# Patient Record
Sex: Female | Born: 1975 | Race: White | Hispanic: No | Marital: Single | Smoking: Current every day smoker
Health system: Southern US, Community
[De-identification: ages and names within clinical notes are randomized; demographics above are authoritative.]

## PROBLEM LIST (undated history)

## (undated) DIAGNOSIS — O009 Unspecified ectopic pregnancy without intrauterine pregnancy: Secondary | ICD-10-CM

## (undated) HISTORY — PX: CHOLECYSTECTOMY: SHX55

## (undated) HISTORY — PX: TUBAL LIGATION: SHX77

---

## 2010-08-12 ENCOUNTER — Emergency Department (HOSPITAL_COMMUNITY)
Admission: EM | Admit: 2010-08-12 | Discharge: 2010-08-12 | Disposition: A | Discharge status: Home / Self Care | Payer: Self-pay | Attending: Emergency Medicine | Admitting: Emergency Medicine

## 2010-08-12 ENCOUNTER — Emergency Department (HOSPITAL_COMMUNITY): Payer: Self-pay

## 2010-08-12 DIAGNOSIS — W108XXA Fall (on) (from) other stairs and steps, initial encounter: Secondary | ICD-10-CM | POA: Insufficient documentation

## 2010-08-12 DIAGNOSIS — S91109A Unspecified open wound of unspecified toe(s) without damage to nail, initial encounter: Secondary | ICD-10-CM | POA: Insufficient documentation

## 2010-08-12 DIAGNOSIS — S93609A Unspecified sprain of unspecified foot, initial encounter: Secondary | ICD-10-CM | POA: Insufficient documentation

## 2011-09-11 ENCOUNTER — Observation Stay (HOSPITAL_COMMUNITY): Payer: Self-pay

## 2011-09-11 ENCOUNTER — Encounter (HOSPITAL_COMMUNITY): Payer: Self-pay | Admitting: Nurse Practitioner

## 2011-09-11 ENCOUNTER — Observation Stay (HOSPITAL_COMMUNITY)
Admission: EM | Admit: 2011-09-11 | Discharge: 2011-09-12 | Disposition: A | Discharge status: Home / Self Care | Payer: Self-pay | Attending: Emergency Medicine | Admitting: Emergency Medicine

## 2011-09-11 DIAGNOSIS — N898 Other specified noninflammatory disorders of vagina: Principal | ICD-10-CM | POA: Insufficient documentation

## 2011-09-11 DIAGNOSIS — R109 Unspecified abdominal pain: Secondary | ICD-10-CM | POA: Insufficient documentation

## 2011-09-11 DIAGNOSIS — A499 Bacterial infection, unspecified: Secondary | ICD-10-CM | POA: Insufficient documentation

## 2011-09-11 DIAGNOSIS — N390 Urinary tract infection, site not specified: Secondary | ICD-10-CM | POA: Insufficient documentation

## 2011-09-11 DIAGNOSIS — N760 Acute vaginitis: Secondary | ICD-10-CM | POA: Insufficient documentation

## 2011-09-11 DIAGNOSIS — B9689 Other specified bacterial agents as the cause of diseases classified elsewhere: Secondary | ICD-10-CM | POA: Insufficient documentation

## 2011-09-11 HISTORY — DX: Unspecified ectopic pregnancy without intrauterine pregnancy: O00.90

## 2011-09-11 LAB — URINE MICROSCOPIC-ADD ON

## 2011-09-11 LAB — DIFFERENTIAL
Basophils Relative: 0 % (ref 0–1)
Eosinophils Absolute: 0.6 10*3/uL (ref 0.0–0.7)
Eosinophils Relative: 8 % — ABNORMAL HIGH (ref 0–5)
Monocytes Relative: 6 % (ref 3–12)
Neutrophils Relative %: 54 % (ref 43–77)

## 2011-09-11 LAB — POCT I-STAT, CHEM 8
Chloride: 103 mEq/L (ref 96–112)
Glucose, Bld: 89 mg/dL (ref 70–99)
HCT: 46 % (ref 36.0–46.0)
Hemoglobin: 15.6 g/dL — ABNORMAL HIGH (ref 12.0–15.0)
Potassium: 3.7 mEq/L (ref 3.5–5.1)
Sodium: 143 mEq/L (ref 135–145)

## 2011-09-11 LAB — URINALYSIS, ROUTINE W REFLEX MICROSCOPIC
Bilirubin Urine: NEGATIVE
Ketones, ur: NEGATIVE mg/dL
Nitrite: POSITIVE — AB
Urobilinogen, UA: 1 mg/dL (ref 0.0–1.0)
pH: 7 (ref 5.0–8.0)

## 2011-09-11 LAB — CBC
Hemoglobin: 14.5 g/dL (ref 12.0–15.0)
MCH: 28.4 pg (ref 26.0–34.0)
MCHC: 33 g/dL (ref 30.0–36.0)
MCV: 86.3 fL (ref 78.0–100.0)

## 2011-09-11 LAB — WET PREP, GENITAL: Trich, Wet Prep: NONE SEEN

## 2011-09-11 LAB — POCT PREGNANCY, URINE: Preg Test, Ur: NEGATIVE

## 2011-09-11 MED ORDER — OXYCODONE-ACETAMINOPHEN 5-325 MG PO TABS
1.0000 | ORAL_TABLET | Freq: Once | ORAL | Status: AC
  Administered 2011-09-11: 1 via ORAL
  Filled 2011-09-11: qty 1

## 2011-09-11 MED ORDER — DEXTROSE 5 % IV SOLN
1.0000 g | Freq: Once | INTRAVENOUS | Status: AC
  Administered 2011-09-11: 1 g via INTRAVENOUS
  Filled 2011-09-11: qty 10

## 2011-09-11 MED ORDER — METRONIDAZOLE 500 MG PO TABS: 500.0000 mg | ORAL_TABLET | Freq: Two times a day (BID) | ORAL | Status: DC

## 2011-09-11 MED ORDER — IOHEXOL 300 MG/ML  SOLN
80.0000 mL | Freq: Once | INTRAMUSCULAR | Status: AC | PRN
  Administered 2011-09-11: 80 mL via INTRAVENOUS

## 2011-09-11 MED ORDER — IOHEXOL 300 MG/ML  SOLN
20.0000 mL | INTRAMUSCULAR | Status: AC
  Administered 2011-09-11: 20 mL via ORAL

## 2011-09-11 MED ORDER — NITROFURANTOIN MONOHYD MACRO 100 MG PO CAPS: 100.0000 mg | ORAL_CAPSULE | Freq: Two times a day (BID) | ORAL | Status: DC

## 2011-09-11 NOTE — ED Provider Notes (Signed)
History     CSN: [id number]  Arrival date & time 09/11/11  1638   First MD Initiated Contact with Patient 09/11/11 2006      Chief Complaint  Patient presents with  . Vaginal Bleeding    (Consider location/radiation/quality/duration/timing/severity/associated sxs/prior treatment) Patient is a 36 y.o. female presenting with vaginal bleeding. The history is provided by the patient and the spouse. No language interpreter was used.  Vaginal Bleeding This is a recurrent problem. The current episode started more than 1 month ago. The problem occurs daily. The problem has been gradually worsening. Associated symptoms include fatigue. Pertinent negatives include no chest pain, fever, headaches, nausea, neck pain, vomiting or weakness. The treatment provided mild relief.   36 year old female coming in with complaints of vaginal bleeding with clot formation. States that her periods are always right on time. She started on June 1 and has not stopped. She is now passing clots which is here in usual for her. She's having general abdominal pain especially in the suprapubic and right upper quadrant. Denies dizziness. States she has been laying in the bed the last 2 days unable to move. States that her stomach has doubled in size in the last week. [facility] Mayford Knife. The second Excedrin for pain. His past medical history tubal ligation 11 years ago ectopic pregnancy 13 years ago abortion 18 years ago.   Past Medical History  Diagnosis Date  . Ectopic pregnancy     Past Surgical History  Procedure Date  . Tubal ligation   . Cholecystectomy     History reviewed. No pertinent family history.  History  Substance Use Topics  . Smoking status: Current Everyday Smoker -- 0.5 packs/day  . Smokeless tobacco: Not on file  . Alcohol Use: Yes     social    OB History    Grav Para Term Preterm Abortions TAB SAB Ect Mult Living                  Review of Systems    Constitutional: Positive for fatigue. Negative for fever.  HENT: Negative for neck pain.   Respiratory: Negative for shortness of breath.   Cardiovascular: Negative for chest pain.  Gastrointestinal: Negative for nausea and vomiting.  Genitourinary: Positive for vaginal bleeding. Negative for dysuria, urgency, flank pain, vaginal discharge, difficulty urinating and vaginal pain.  Musculoskeletal: Negative for back pain.  Neurological: Negative for dizziness, weakness, light-headedness and headaches.  All other systems reviewed and are negative.    Allergies  Review of patient's allergies indicates no known allergies.  Home Medications   Current Outpatient Rx  Name Route Sig Dispense Refill  . EXCEDRIN PO Oral Take 2 tablets by mouth every 6 (six) hours as needed. For pain      BP 105/74  Pulse 66  Temp 97.1 F (36.2 C) (Oral)  Resp 16  Ht 5\' 11"  (1.803 m)  Wt 200 lb (90.719 kg)  BMI 27.89 kg/m2  SpO2 100%  Physical Exam  Nursing note and vitals reviewed. Constitutional: She is oriented to person, place, and time. She appears well-developed and well-nourished.  HENT:  Head: Normocephalic and atraumatic.  Eyes: Conjunctivae and EOM are normal. Pupils are equal, round, and reactive to light.  Neck: Normal range of motion. Neck supple.  Cardiovascular: Normal rate.   Pulmonary/Chest: Effort normal.  Abdominal: Soft. Bowel sounds are normal. She exhibits no distension.  Genitourinary: Vagina normal. There is no rash, tenderness or lesion on the right  labia. There is no rash, tenderness or lesion on the left labia. Cervix exhibits no motion tenderness, no discharge and no friability. Right adnexum displays tenderness. Right adnexum displays no mass. Left adnexum displays tenderness. Left adnexum displays no mass. No erythema around the vagina. No signs of injury around the vagina. No vaginal discharge found.  Musculoskeletal: Normal range of motion. She exhibits no edema and no  tenderness.  Neurological: She is alert and oriented to person, place, and time. She has normal reflexes.  Skin: Skin is warm and dry.  Psychiatric: She has a normal mood and affect.    ED Course  Pelvic exam Date/Time: 09/11/2011 8:19 PM Performed by: Remi Haggard Authorized by: Remi Haggard Consent: Verbal consent obtained. Written consent not obtained. Risks and benefits: risks, benefits and alternatives were discussed Consent given by: patient Patient understanding: patient states understanding of the procedure being performed Patient identity confirmed: verbally with patient, arm band, provided demographic data and hospital-assigned identification number Time out: Immediately prior to procedure a "time out" was called to verify the correct patient, procedure, equipment, support staff and site/side marked as required. Local anesthesia used: no Patient sedated: no Patient tolerance: Patient tolerated the procedure well with no immediate complications.   (including critical care time)  Labs Reviewed  DIFFERENTIAL - Abnormal; Notable for the following:    Eosinophils Relative 8 (*)     All other components within normal limits  POCT I-STAT, CHEM 8 - Abnormal; Notable for the following:    Hemoglobin 15.6 (*)     All other components within normal limits  CBC  POCT PREGNANCY, URINE  URINALYSIS, ROUTINE W REFLEX MICROSCOPIC  WET PREP, GENITAL  GC/CHLAMYDIA PROBE AMP, GENITAL   No results found.   No diagnosis found.    MDM  2130pm patient will be move to CDU awaiting a CT of the abdomen and pelvis for heavy vaginal bleeding, suprapubic, right upper quadrant right lower quadrant pain. Report given to [facility].       Remi Haggard, NP 09/11/11 2322

## 2011-09-11 NOTE — ED Provider Notes (Signed)
10:52 PM Patient is in CDU holding for CT abd/pelvis.  Sign out received from Remi Haggard, NP.  Pt with abnormal vaginal bleeding and abdominal pain, found to have BV and UTI.   Pt being given rocephin in CDU.  Anticipate d/c home with Rx for flagyl and macrobid.  Pt to have CT abd/pelvis, dispo pending results.   Pt reporting continued pain in her abdomen, described as crampy in her lower abdomen and gassy in her upper abdomen.  CT tech currently taking pt to CT - will continue to follow pt and examine upon her return.    Abdomen soft, nondistended, tender to palpation throughout lower abdomen, no guarding, no rebound.  Pt concerned about cost of medications - I have discussed this with Thurston Hole who will change Rx from macrobid to something more affordable.   11:58 PM Ct showed question of polyp or mass in the endometrium.  Will send for pelvic US.  Results discussed with patient.    Remi Haggard, NP, reassumes care of patient at change of shift.  Patient also discussed with Dr Judd Lien, overnight doctor.    Results for orders placed during the hospital encounter of 09/11/11  URINALYSIS, ROUTINE W REFLEX MICROSCOPIC      Component Value Range   Color, Urine AMBER (*) YELLOW   APPearance CLOUDY (*) CLEAR   Specific Gravity, Urine 1.024  1.005 - 1.030   pH 7.0  5.0 - 8.0   Glucose, UA NEGATIVE  NEGATIVE mg/dL   Hgb urine dipstick LARGE (*) NEGATIVE   Bilirubin Urine NEGATIVE  NEGATIVE   Ketones, ur NEGATIVE  NEGATIVE mg/dL   Protein, ur 30 (*) NEGATIVE mg/dL   Urobilinogen, UA 1.0  0.0 - 1.0 mg/dL   Nitrite POSITIVE (*) NEGATIVE   Leukocytes, UA TRACE (*) NEGATIVE  CBC      Component Value Range   WBC 7.3  4.0 - 10.5 K/uL   RBC 5.10  3.87 - 5.11 MIL/uL   Hemoglobin 14.5  12.0 - 15.0 g/dL   HCT 91.4  78.2 - 95.6 %   MCV 86.3  78.0 - 100.0 fL   MCH 28.4  26.0 - 34.0 pg   MCHC 33.0  30.0 - 36.0 g/dL   RDW 21.3  08.6 - 57.8 %   Platelets 267  150 - 400 K/uL  DIFFERENTIAL      Component Value  Range   Neutrophils Relative 54  43 - 77 %   Neutro Abs 4.0  1.7 - 7.7 K/uL   Lymphocytes Relative 32  12 - 46 %   Lymphs Abs 2.3  0.7 - 4.0 K/uL   Monocytes Relative 6  3 - 12 %   Monocytes Absolute 0.5  0.1 - 1.0 K/uL   Eosinophils Relative 8 (*) 0 - 5 %   Eosinophils Absolute 0.6  0.0 - 0.7 K/uL   Basophils Relative 0  0 - 1 %   Basophils Absolute 0.0  0.0 - 0.1 K/uL  WET PREP, GENITAL      Component Value Range   Yeast Wet Prep HPF POC NONE SEEN  NONE SEEN   Trich, Wet Prep NONE SEEN  NONE SEEN   Clue Cells Wet Prep HPF POC MODERATE (*) NONE SEEN   WBC, Wet Prep HPF POC NONE SEEN  NONE SEEN  POCT PREGNANCY, URINE      Component Value Range   Preg Test, Ur NEGATIVE  NEGATIVE  POCT I-STAT, CHEM 8      Component  Value Range   Sodium 143  135 - 145 mEq/L   Potassium 3.7  3.5 - 5.1 mEq/L   Chloride 103  96 - 112 mEq/L   BUN 7  6 - 23 mg/dL   Creatinine, Ser 4.69  0.50 - 1.10 mg/dL   Glucose, Bld 89  70 - 99 mg/dL   Calcium, Ion 6.29  5.28 - 1.32 mmol/L   TCO2 25  0 - 100 mmol/L   Hemoglobin 15.6 (*) 12.0 - 15.0 g/dL   HCT 41.3  24.4 - 01.0 %  URINE MICROSCOPIC-ADD ON      Component Value Range   Squamous Epithelial / LPF FEW (*) RARE   WBC, UA 3-6  <3 WBC/hpf   RBC / HPF 11-20  <3 RBC/hpf   Urine-Other AMORPHOUS URATES/PHOSPHATES     Ct Abdomen Pelvis W Contrast  09/11/2011  *RADIOLOGY REPORT*  Clinical Data: Lower abdominal pain, vaginal bleeding and cramping for 2 weeks.  History of ectopic pregnancy and tubal ligation.  CT ABDOMEN AND PELVIS WITH CONTRAST  Technique:  Multidetector CT imaging of the abdomen and pelvis was performed following the standard protocol during bolus administration of intravenous contrast.  Contrast: 80mL OMNIPAQUE IOHEXOL 300 MG/ML  SOLN  Comparison: None.  Findings: The visualized lung bases are clear.  The liver and spleen are unremarkable in appearance.  The patient is status post cholecystectomy, with clips noted at the gallbladder fossa.  The  pancreas and adrenal glands are unremarkable.  Mild scarring is noted at the upper pole of the right kidney; the kidneys are otherwise unremarkable in appearance.  There is no evidence of hydronephrosis.  No renal or ureteral stones are seen. No perinephric stranding is appreciated.  No free fluid is identified.  The small bowel is unremarkable in appearance.  The stomach is within normal limits.  No acute vascular abnormalities are seen.  The appendix is normal in caliber and contains air, without evidence for appendicitis.  The colon is unremarkable in appearance.  The bladder is mildly distended and grossly unremarkable in appearance.  A focus of enhancement is noted within the endometrial canal, raising question for a polyp or other mass.  Further evaluation is recommended.  The ovaries are relatively symmetric; no suspicious adnexal masses are seen.  A tiny hernia is noted at the left inguinal region.  No inguinal lymphadenopathy is seen.  No acute osseous abnormalities are identified.  IMPRESSION:  1.  Focus of enhancement within the endometrial canal raises question for a polyp or other mass.  Further evaluation is recommended. 2.  Tiny left inguinal hernia incidentally noted, containing only fat. 3.  Mild scarring at the upper pole of the right kidney.  Original Report Authenticated By: Tonia Ghent, M.D.      Rise Patience, [place] 09/12/11 0000

## 2011-09-11 NOTE — ED Notes (Signed)
Pt c/o vag bleeding with clots, and severe abd cramping x 2 weeks. States she has never had an abnormal period prior to this one. Tubal ligation 11 years ago. Has been changing pads q 1-2 hours

## 2011-09-11 NOTE — ED Notes (Signed)
Pt finished drinking contrast, CT made aware.

## 2011-09-12 LAB — GC/CHLAMYDIA PROBE AMP, GENITAL: Chlamydia, DNA Probe: NEGATIVE

## 2011-09-12 MED ORDER — CIPROFLOXACIN HCL 500 MG PO TABS: 500.0000 mg | ORAL_TABLET | Freq: Two times a day (BID) | ORAL | Status: AC

## 2011-09-12 MED ORDER — OXYCODONE-ACETAMINOPHEN 5-325 MG PO TABS: 2.0000 | ORAL_TABLET | ORAL | Status: AC | PRN

## 2011-09-12 MED ORDER — MEDROXYPROGESTERONE ACETATE 5 MG PO TABS: 5.0000 mg | ORAL_TABLET | Freq: Every day | ORAL | Status: DC

## 2011-09-12 MED ORDER — ONDANSETRON HCL 4 MG PO TABS: 4.0000 mg | ORAL_TABLET | Freq: Four times a day (QID) | ORAL | Status: AC

## 2011-09-12 MED ORDER — METRONIDAZOLE 500 MG PO TABS: 500.0000 mg | ORAL_TABLET | Freq: Two times a day (BID) | ORAL | Status: AC

## 2011-09-12 NOTE — ED Provider Notes (Signed)
Medical screening examination/treatment/procedure(s) were performed by non-physician practitioner and as supervising physician I was immediately available for consultation/collaboration.   Gerhard Munch, MD 09/12/11 [phone]

## 2011-09-12 NOTE — ED Notes (Signed)
Pt back from CT.  Denies abd pain or nausea, but continues to feel lower back pain. Back pain decreased when pillow placed behind lower back.

## 2011-09-12 NOTE — ED Notes (Signed)
Patient transported to Ultrasound 

## 2011-09-12 NOTE — Discharge Instructions (Signed)
Hannah Frost the tests that we did on the pelvic exam showed that she had a section called bacterial vaginosis. This is treated with Flagyl do not drink alcohol with Flagyl. You also have a urinary tract infection he'll dizzy IV antibiotics in the ER and you need to continue to take the antibiotics are prescribed until air column. The Percocet is for pain but do not drive with this medication. Zofran is for nausea. The ultrasound and CT scan showed that you have a polyp that probably needs to be biopsied.  Get a GYN at the [facility] one of your own to follow up with.  Your labs were normal.  The provera is for the excessive bleeding.   Bacterial Vaginosis Bacterial vaginosis (BV) is a vaginal infection where the normal balance of bacteria in the vagina is disrupted. The normal balance is then replaced by an overgrowth of certain bacteria. There are several different kinds of bacteria that can cause BV. BV is the most common vaginal infection in women of childbearing age. CAUSES   The cause of BV is not fully understood. BV develops when there is an increase or imbalance of harmful bacteria.   Some activities or behaviors can upset the normal balance of bacteria in the vagina and put women at increased risk including:   Having a new sex partner or multiple sex partners.   Douching.   Using an intrauterine device (IUD) for contraception.   It is not clear what role sexual activity plays in the development of BV. However, women that have never had sexual intercourse are rarely infected with BV.  Women do not get BV from toilet seats, bedding, swimming pools or from touching objects around them.  SYMPTOMS   Grey vaginal discharge.   A fish-like odor with discharge, especially after sexual intercourse.   Itching or burning of the vagina and vulva.   Burning or pain with urination.   Some women have no signs or symptoms at all.  DIAGNOSIS  Your caregiver must examine the vagina for  signs of BV. Your caregiver will perform lab tests and look at the sample of vaginal fluid through a microscope. They will look for bacteria and abnormal cells (clue cells), a pH test higher than 4.5, and a positive amine test all associated with BV.  RISKS AND COMPLICATIONS   Pelvic inflammatory disease (PID).   Infections following gynecology surgery.   Developing HIV.   Developing herpes virus.  TREATMENT  Sometimes BV will clear up without treatment. However, all women with symptoms of BV should be treated to avoid complications, especially if gynecology surgery is planned. Female partners generally do not need to be treated. However, BV may spread between female sex partners so treatment is helpful in preventing a recurrence of BV.   BV may be treated with antibiotics. The antibiotics come in either pill or vaginal cream forms. Either can be used with nonpregnant or pregnant women, but the recommended dosages differ. These antibiotics are not harmful to the baby.   BV can recur after treatment. If this happens, a second round of antibiotics will often be prescribed.   Treatment is important for pregnant women. If not treated, BV can cause a premature delivery, especially for a pregnant woman who had a premature birth in the past. All pregnant women who have symptoms of BV should be checked and treated.   For chronic reoccurrence of BV, treatment with a type of prescribed gel vaginally twice a week is helpful.  HOME CARE INSTRUCTIONS   Finish all medication as directed by your caregiver.   Do not have sex until treatment is completed.   Tell your sexual partner that you have a vaginal infection. They should see their caregiver and be treated if they have problems, such as a mild rash or itching.   Practice safe sex. Use condoms. Only have 1 sex partner.  PREVENTION  Basic prevention steps can help reduce the risk of upsetting the natural balance of bacteria in the vagina and  developing BV:  Do not have sexual intercourse (be abstinent).   Do not douche.   Use all of the medicine prescribed for treatment of BV, even if the signs and symptoms go away.   Tell your sex partner if you have BV. That way, they can be treated, if needed, to prevent reoccurrence.  SEEK MEDICAL CARE IF:   Your symptoms are not improving after 3 days of treatment.   You have increased discharge, pain, or fever.  MAKE SURE YOU:   Understand these instructions.   Will watch your condition.   Will get help right away if you are not doing well or get worse.  FOR MORE INFORMATION  Division of STD Prevention (DSTDP), Centers for Disease Control and Prevention: [employer] American Social Health Association (ASHA): www.ashastd.org  Document Released: 03/16/2005 Document Revised: 03/05/2011 Document Reviewed: 09/06/2008 [facility] Patient Information 2012 [place], [place].Bacterial Vaginosis Bacterial vaginosis (BV) is a vaginal infection where the normal balance of bacteria in the vagina is disrupted. The normal balance is then replaced by an overgrowth of certain bacteria. There are several different kinds of bacteria that can cause BV. BV is the most common vaginal infection in women of childbearing age. CAUSES   The cause of BV is not fully understood. BV develops when there is an increase or imbalance of harmful bacteria.   Some activities or behaviors can upset the normal balance of bacteria in the vagina and put women at increased risk including:   Having a new sex partner or multiple sex partners.   Douching.   Using an intrauterine device (IUD) for contraception.   It is not clear what role sexual activity plays in the development of BV. However, women that have never had sexual intercourse are rarely infected with BV.  Women do not get BV from toilet seats, bedding, swimming pools or from touching objects around them.  SYMPTOMS   Grey vaginal discharge.   A fish-like  odor with discharge, especially after sexual intercourse.   Itching or burning of the vagina and vulva.   Burning or pain with urination.   Some women have no signs or symptoms at all.  DIAGNOSIS  Your caregiver must examine the vagina for signs of BV. Your caregiver will perform lab tests and look at the sample of vaginal fluid through a microscope. They will look for bacteria and abnormal cells (clue cells), a pH test higher than 4.5, and a positive amine test all associated with BV.  RISKS AND COMPLICATIONS   Pelvic inflammatory disease (PID).   Infections following gynecology surgery.   Developing HIV.   Developing herpes virus.  TREATMENT  Sometimes BV will clear up without treatment. However, all women with symptoms of BV should be treated to avoid complications, especially if gynecology surgery is planned. Female partners generally do not need to be treated. However, BV may spread between female sex partners so treatment is helpful in preventing a recurrence of BV.   BV may be treated  with antibiotics. The antibiotics come in either pill or vaginal cream forms. Either can be used with nonpregnant or pregnant women, but the recommended dosages differ. These antibiotics are not harmful to the baby.   BV can recur after treatment. If this happens, a second round of antibiotics will often be prescribed.   Treatment is important for pregnant women. If not treated, BV can cause a premature delivery, especially for a pregnant woman who had a premature birth in the past. All pregnant women who have symptoms of BV should be checked and treated.   For chronic reoccurrence of BV, treatment with a type of prescribed gel vaginally twice a week is helpful.  HOME CARE INSTRUCTIONS   Finish all medication as directed by your caregiver.   Do not have sex until treatment is completed.   Tell your sexual partner that you have a vaginal infection. They should see their caregiver and be treated if  they have problems, such as a mild rash or itching.   Practice safe sex. Use condoms. Only have 1 sex partner.  PREVENTION  Basic prevention steps can help reduce the risk of upsetting the natural balance of bacteria in the vagina and developing BV:  Do not have sexual intercourse (be abstinent).   Do not douche.   Use all of the medicine prescribed for treatment of BV, even if the signs and symptoms go away.   Tell your sex partner if you have BV. That way, they can be treated, if needed, to prevent reoccurrence.  SEEK MEDICAL CARE IF:   Your symptoms are not improving after 3 days of treatment.   You have increased discharge, pain, or fever.  MAKE SURE YOU:   Understand these instructions.   Will watch your condition.   Will get help right away if you are not doing well or get worse.  FOR MORE INFORMATION  Division of STD Prevention (DSTDP), Centers for Disease Control and Prevention: [employer] American Social Health Association (ASHA): www.ashastd.org  Document Released: 03/16/2005 Document Revised: 03/05/2011 Document Reviewed: 09/06/2008 [facility] Patient Information 2012 [place], [place].

## 2011-09-12 NOTE — ED Provider Notes (Signed)
I have been available throughout the patient's care both in the emergency department and in the CDU.   Gerhard Munch, MD 09/12/11 [phone]

## 2012-12-14 IMAGING — CT CT ABD-PELV W/ CM
2 of 4 series · 17 of 46 positions shown, 19 images · IV contrast (omnipaque)
Comparison: None.

CLINICAL DATA: Lower abdominal pain, vaginal bleeding and cramping
for 2 weeks.  History of ectopic pregnancy and tubal ligation.

CT ABDOMEN AND PELVIS WITH CONTRAST
TECHNIQUE: Multidetector CT imaging of the abdomen and pelvis was
performed following the standard protocol during bolus
administration of intravenous contrast.
Contrast: 80mL OMNIPAQUE IOHEXOL 300 MG/ML  SOLN

[Series 2: abd/pelv with 5.0 b31f st · axial · 0.79mm/px · z∈[-420,+20]mm · 14 of 97 slices shown, 16 images]
[im 5/97  soft-tissue]
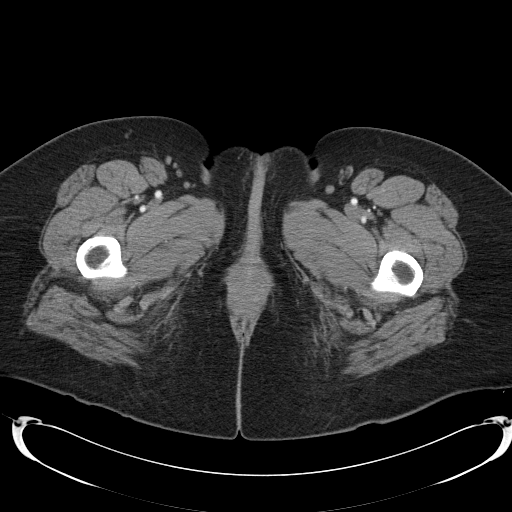
[im 5/97  bone]
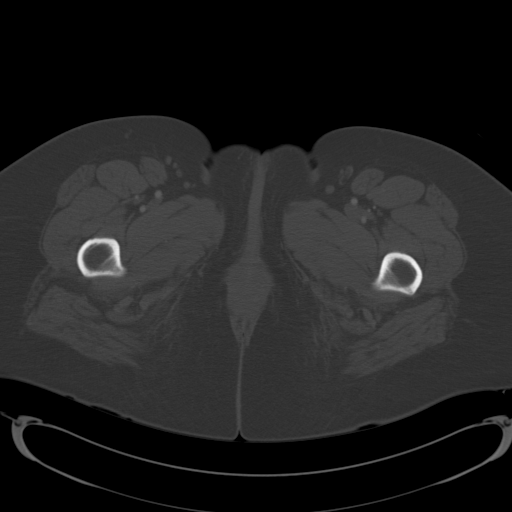
[im 13/97  soft-tissue]
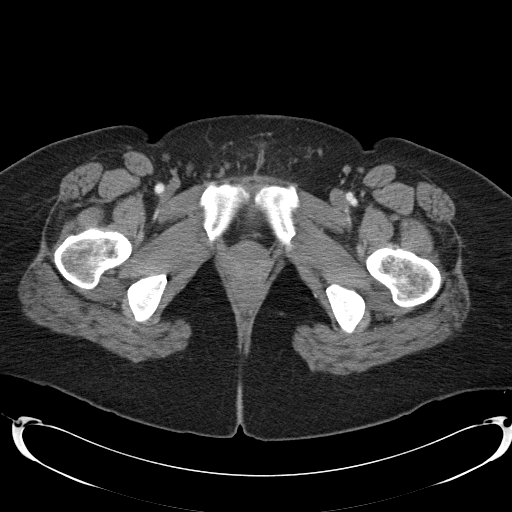
[im 21/97  soft-tissue]
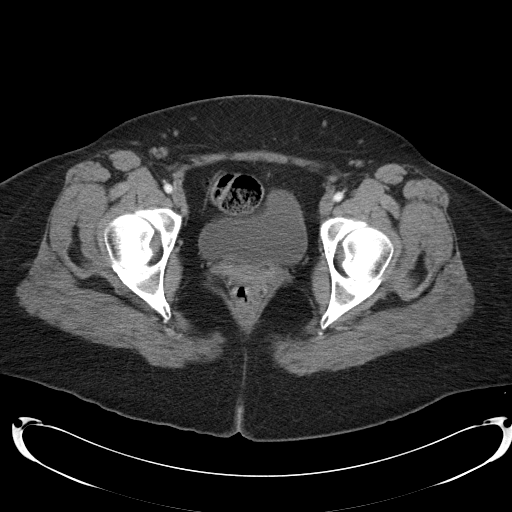
[im 25/97  soft-tissue]
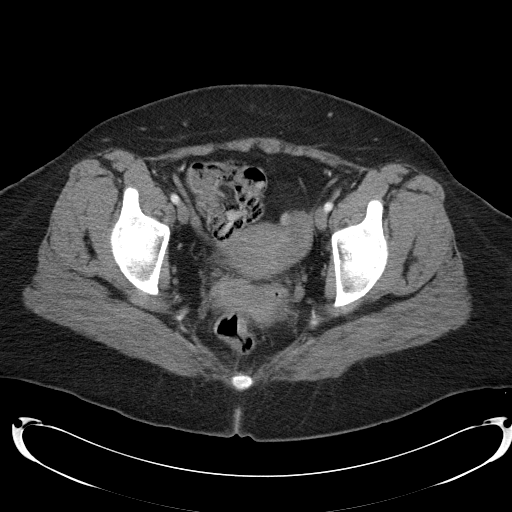
[im 33/97  soft-tissue]
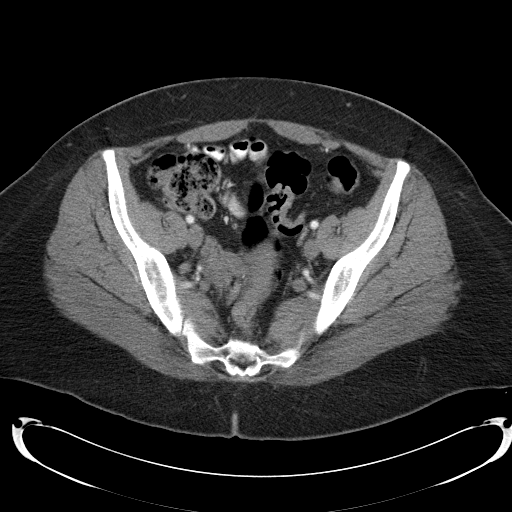
[im 41/97  soft-tissue]
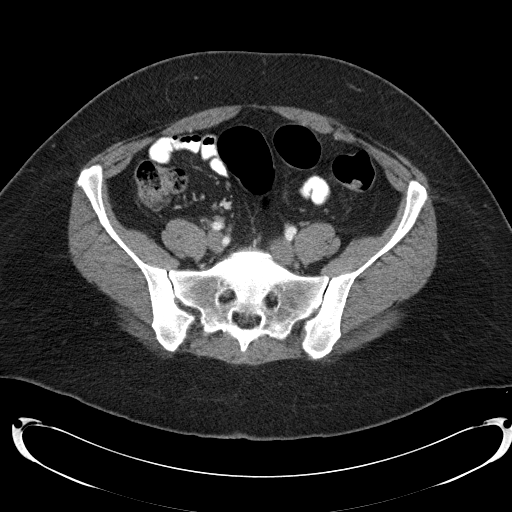
[im 45/97  soft-tissue]
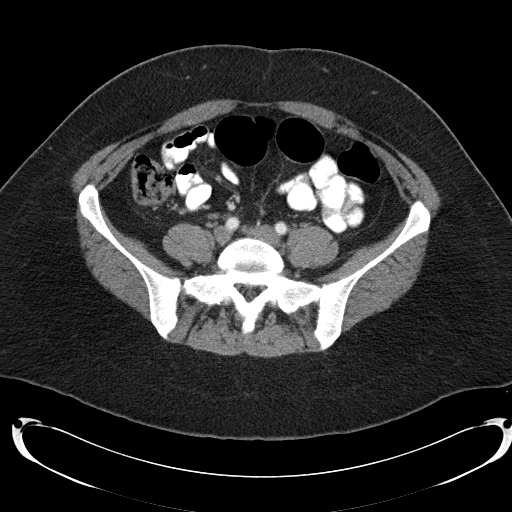
[im 53/97  soft-tissue]
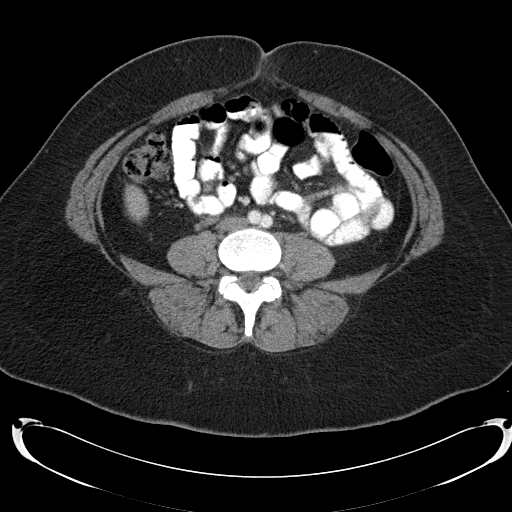
[im 57/97  soft-tissue]
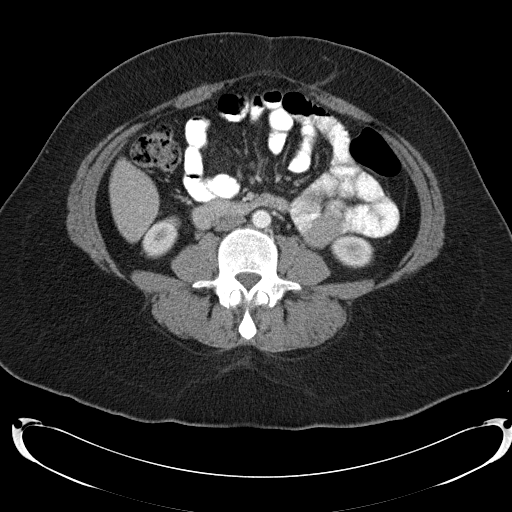
[im 57/97  bone]
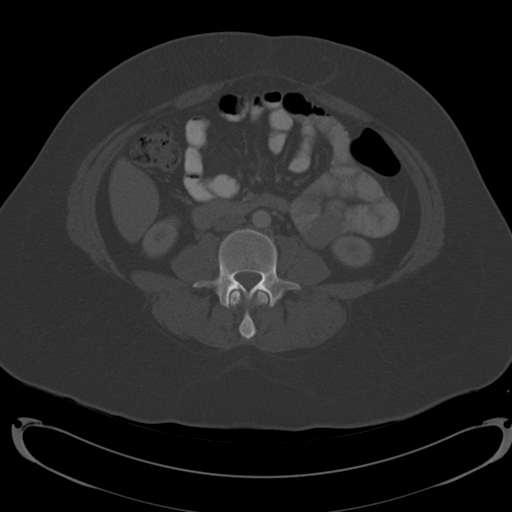
[im 65/97  soft-tissue]
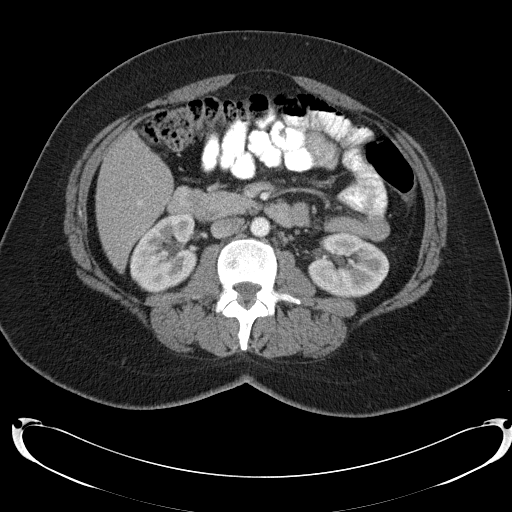
[im 73/97  soft-tissue]
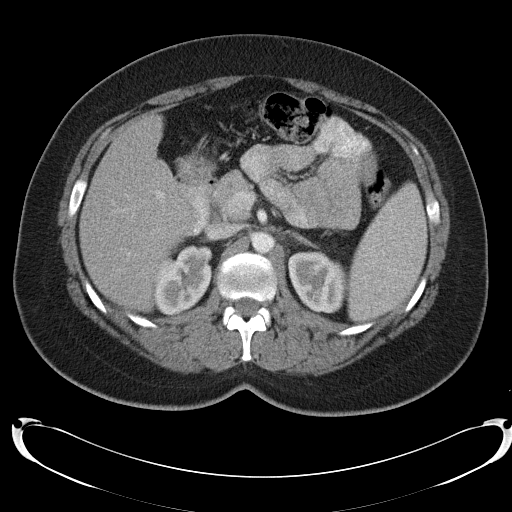
[im 77/97  soft-tissue]
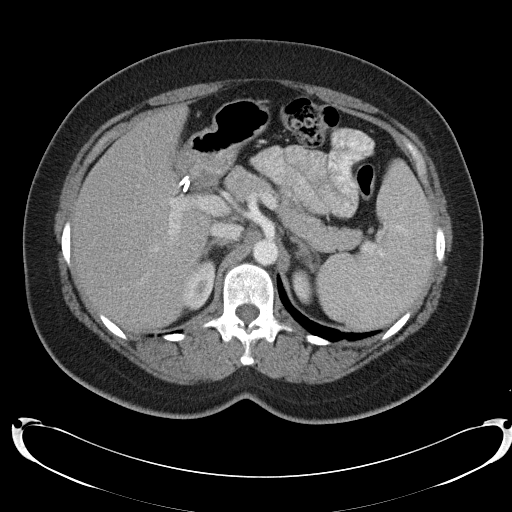
[im 85/97  soft-tissue]
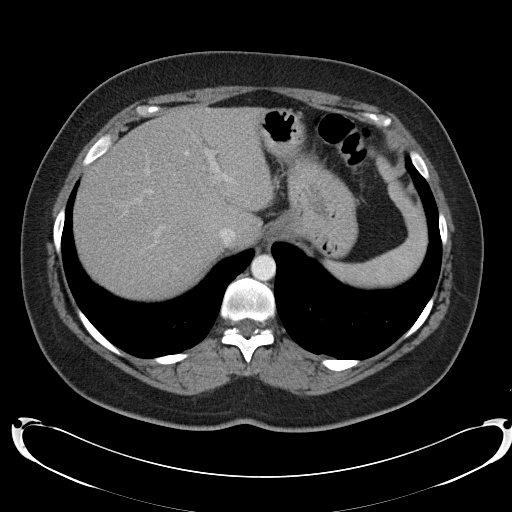
[im 93/97  soft-tissue]
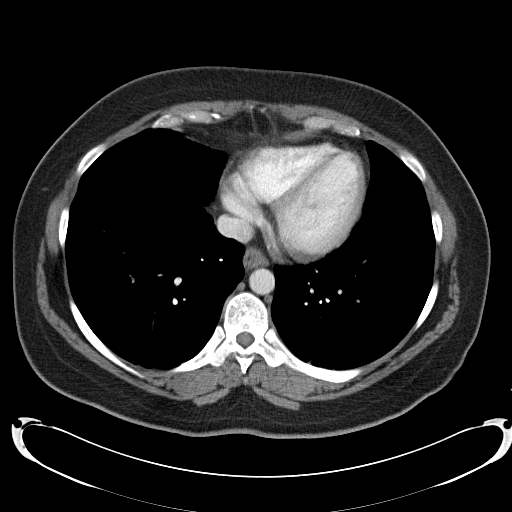

[Series 5: coronals · coronal · 0.94mm/px · 3 of 151 slices shown]
[im 51/151  soft-tissue]
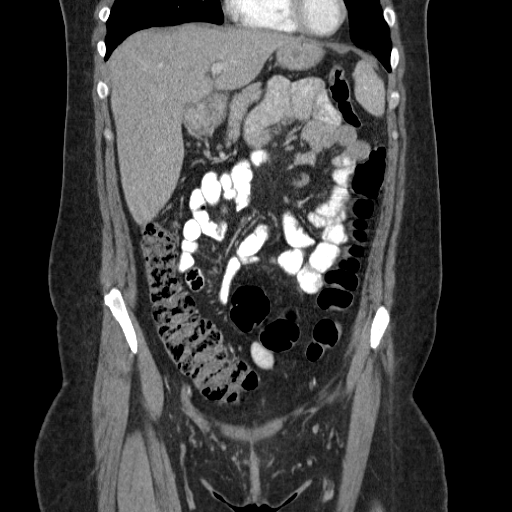
[im 67/151  soft-tissue]
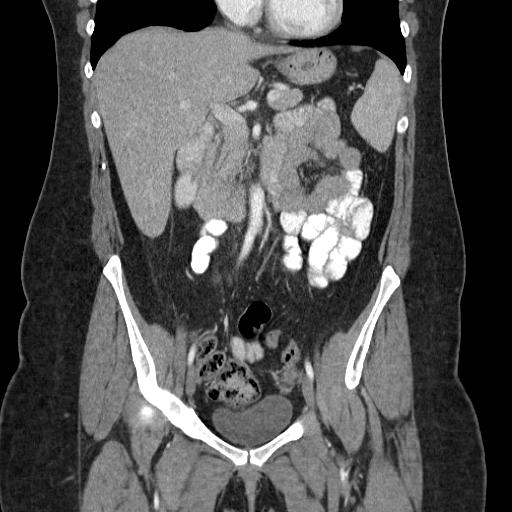
[im 84/151  soft-tissue]
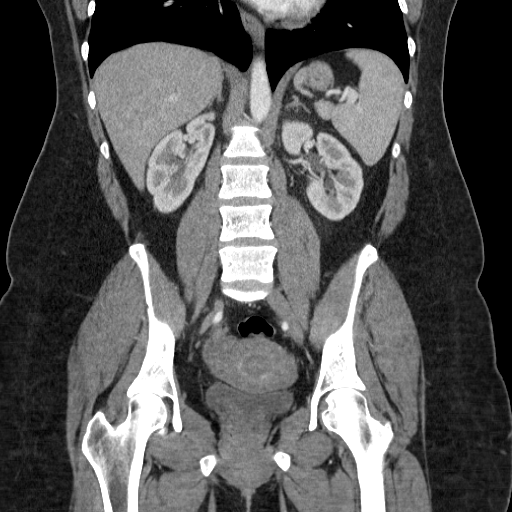

[17 of 46 positions shown; findings below may reference images not displayed]

FINDINGS: The visualized lung bases are clear.

The liver and spleen are unremarkable in appearance.  The patient
is status post cholecystectomy, with clips noted at the gallbladder
fossa.  The pancreas and adrenal glands are unremarkable.

Mild scarring is noted at the upper pole of the right kidney; the
kidneys are otherwise unremarkable in appearance.  There is no
evidence of hydronephrosis.  No renal or ureteral stones are seen.
No perinephric stranding is appreciated.

No free fluid is identified.  The small bowel is unremarkable in
appearance.  The stomach is within normal limits.  No acute
vascular abnormalities are seen.

The appendix is normal in caliber and contains air, without
evidence for appendicitis.  The colon is unremarkable in
appearance.

The bladder is mildly distended and grossly unremarkable in
appearance.  A focus of enhancement is noted within the endometrial
canal, raising question for a polyp or other mass.  Further
evaluation is recommended.  The ovaries are relatively symmetric;
no suspicious adnexal masses are seen.  A tiny hernia is noted at
the left inguinal region.  No inguinal lymphadenopathy is seen.

No acute osseous abnormalities are identified.
IMPRESSION: 1.  Focus of enhancement within the endometrial canal raises
question for a polyp or other mass.  Further evaluation is
recommended.
2.  Tiny left inguinal hernia incidentally noted, containing only
fat.
3.  Mild scarring at the upper pole of the right kidney.

## 2013-07-23 ENCOUNTER — Emergency Department (HOSPITAL_COMMUNITY)
Admission: EM | Admit: 2013-07-23 | Discharge: 2013-07-23 | Disposition: A | Discharge status: Home / Self Care | Payer: Self-pay | Attending: Emergency Medicine | Admitting: Emergency Medicine

## 2013-07-23 ENCOUNTER — Encounter (HOSPITAL_COMMUNITY): Payer: Self-pay | Admitting: Emergency Medicine

## 2013-07-23 DIAGNOSIS — F17200 Nicotine dependence, unspecified, uncomplicated: Secondary | ICD-10-CM | POA: Insufficient documentation

## 2013-07-23 DIAGNOSIS — IMO0002 Reserved for concepts with insufficient information to code with codable children: Secondary | ICD-10-CM

## 2013-07-23 DIAGNOSIS — H8110 Benign paroxysmal vertigo, unspecified ear: Secondary | ICD-10-CM | POA: Insufficient documentation

## 2013-07-23 LAB — COMPREHENSIVE METABOLIC PANEL
ALK PHOS: 58 U/L (ref 39–117)
ALT: 24 U/L (ref 0–35)
AST: 21 U/L (ref 0–37)
Albumin: 3.5 g/dL (ref 3.5–5.2)
BILIRUBIN TOTAL: 0.2 mg/dL — AB (ref 0.3–1.2)
BUN: 11 mg/dL (ref 6–23)
CO2: 24 mEq/L (ref 19–32)
Calcium: 9.2 mg/dL (ref 8.4–10.5)
Chloride: 102 mEq/L (ref 96–112)
Creatinine, Ser: 0.97 mg/dL (ref 0.50–1.10)
GFR calc Af Amer: 84 mL/min — ABNORMAL LOW (ref >90)
GFR calc non Af Amer: 73 mL/min — ABNORMAL LOW (ref >90)
Glucose, Bld: 92 mg/dL (ref 70–99)
POTASSIUM: 4.1 meq/L (ref 3.7–5.3)
SODIUM: 137 meq/L (ref 137–147)
TOTAL PROTEIN: 7 g/dL (ref 6.0–8.3)

## 2013-07-23 LAB — CBC WITH DIFFERENTIAL/PLATELET
BASOS PCT: 0 % (ref 0–1)
Basophils Absolute: 0 10*3/uL (ref 0.0–0.1)
Eosinophils Absolute: 0.4 10*3/uL (ref 0.0–0.7)
Eosinophils Relative: 5 % (ref 0–5)
HCT: 43.2 % (ref 36.0–46.0)
Hemoglobin: 13.8 g/dL (ref 12.0–15.0)
Lymphocytes Relative: 33 % (ref 12–46)
Lymphs Abs: 2.5 10*3/uL (ref 0.7–4.0)
MCH: 26.4 pg (ref 26.0–34.0)
MCHC: 31.9 g/dL (ref 30.0–36.0)
MCV: 82.6 fL (ref 78.0–100.0)
MONOS PCT: 7 % (ref 3–12)
Monocytes Absolute: 0.5 10*3/uL (ref 0.1–1.0)
NEUTROS PCT: 55 % (ref 43–77)
Neutro Abs: 4.2 10*3/uL (ref 1.7–7.7)
PLATELETS: 249 10*3/uL (ref 150–400)
RBC: 5.23 MIL/uL — ABNORMAL HIGH (ref 3.87–5.11)
RDW: 14.7 % (ref 11.5–15.5)
WBC: 7.6 10*3/uL (ref 4.0–10.5)

## 2013-07-23 MED ORDER — MECLIZINE HCL 50 MG PO TABS: 50.0000 mg | ORAL_TABLET | Freq: Three times a day (TID) | ORAL | Status: AC | PRN

## 2013-07-23 NOTE — ED Notes (Signed)
Pt. reports dizziness for 4 months ,  worse when lying on her left side states " feels like I'm falling " , also reports fell 2 weeks ago with no injury , intermittent tingling at left 4th/5th finger. Alert and oriented / speech clear / no facial asymmetry /ambulatory.

## 2013-07-23 NOTE — Discharge Instructions (Signed)
Benign Positional Vertigo Vertigo means you feel like you or your surroundings are moving when they are not. Benign positional vertigo is the most common form of vertigo. Benign means that the cause of your condition is not serious. Benign positional vertigo is more common in older adults. CAUSES  Benign positional vertigo is the result of an upset in the labyrinth system. This is an area in the middle ear that helps control your balance. This may be caused by a viral infection, head injury, or repetitive motion. However, often no specific cause is found. SYMPTOMS  Symptoms of benign positional vertigo occur when you move your head or eyes in different directions. Some of the symptoms may include:  Loss of balance and falls.  Vomiting.  Blurred vision.  Dizziness.  Nausea.  Involuntary eye movements (nystagmus). DIAGNOSIS  Benign positional vertigo is usually diagnosed by physical exam. If the specific cause of your benign positional vertigo is unknown, your caregiver may perform imaging tests, such as magnetic resonance imaging (MRI) or computed tomography (CT). TREATMENT  Your caregiver may recommend movements or procedures to correct the benign positional vertigo. Medicines such as meclizine, benzodiazepines, and medicines for nausea may be used to treat your symptoms. In rare cases, if your symptoms are caused by certain conditions that affect the inner ear, you may need surgery. HOME CARE INSTRUCTIONS   Follow your caregiver's instructions.  Move slowly. Do not make sudden body or head movements.  Avoid driving.  Avoid operating heavy machinery.  Avoid performing any tasks that would be dangerous to you or others during a vertigo episode.  Drink enough fluids to keep your urine clear or pale yellow. SEEK IMMEDIATE MEDICAL CARE IF:   You develop problems with walking, weakness, numbness, or using your arms, hands, or legs.  You have difficulty speaking.  You develop  severe headaches.  Your nausea or vomiting continues or gets worse.  You develop visual changes.  Your family or friends notice any behavioral changes.  Your condition gets worse.  You have a fever.  You develop a stiff neck or sensitivity to light. MAKE SURE YOU:   Understand these instructions.  Will watch your condition.  Will get help right away if you are not doing well or get worse. Document Released: 12/22/2005 Document Revised: 06/08/2011 Document Reviewed: 12/04/2010 [facility] Patient Information 2014 [place], [place].  Emergency Department Resource Guide 1) Find a Doctor and Pay Out of Pocket Although you won't have to find out who is covered by your insurance plan, it is a good idea to ask around and get recommendations. You will then need to call the office and see if the doctor you have chosen will accept you as a new patient and what types of options they offer for patients who are self-pay. Some doctors offer discounts or will set up payment plans for their patients who do not have insurance, but you will need to ask so you aren't surprised when you get to your appointment.  2) Contact Your Local Health Department Not all health departments have doctors that can see patients for sick visits, but many do, so it is worth a call to see if yours does. If you don't know where your local health department is, you can check in your phone book. The CDC also has a tool to help you locate your state's health department, and many state websites also have listings of all of their local health departments.  3) Find a Walk-in Clinic If your illness  is not likely to be very severe or complicated, you may want to try a walk in clinic. These are popping up all over the country in pharmacies, drugstores, and shopping centers. They're usually staffed by nurse practitioners or physician assistants that have been trained to treat common illnesses and complaints. They're usually fairly quick  and inexpensive. However, if you have serious medical issues or chronic medical problems, these are probably not your best option.  No Primary Care Doctor: - Call Health Connect at  [phone] - they can help you locate a primary care doctor that  accepts your insurance, provides certain services, etc. - Physician Referral Service- [phone]  Chronic Pain Problems: Organization         Address  Phone   Notes  [facility]  [phone] Patients need to be referred by their primary care doctor.   Medication Assistance: Organization         Address  Phone   Notes  [facility] Medication [facility] [address] [place] [place], [place] [address] [phone] --Must be a resident of [facility] -- Must have NO insurance coverage whatsoever (no Medicaid/ Medicare, etc.) -- The pt. MUST have a primary care doctor that directs their care regularly and follows them in the community   [facility]ist  [phone]   [employer]  [phone]    Agencies that provide inexpensive medical care: Organization         Address  Phone   Notes  [facility]  [phone]   [facility]    [phone]   [facility] [address] [place], [place] [address] [phone]   [facility] [address] [place] [place] [phone]   Planned Parenthood    [phone]   [facility]    [phone]   [facility] and [facility]  [address], [place] Phone:  [phone], Fax:  [phone] Hours of Operation:  9 am - 6 pm, M-F.  Also accepts Medicaid/Medicare and self-pay.  [facility]  [address], [place] Phone: [phone], Fax: [phone]. Hours of Operation:  8:30 am - 5:30 pm, M-F.  Also accepts Medicaid and self-pay.  [facility] [place] [address] [place] Phone: [phone]    [facility] [address] Natasha Bence [place], [place] [phone], Ext. [phone] Mondays & Thursdays: 7-9 AM.  First 15 patients are seen on a first come, first serve basis.    Medicaid-accepting [facility] Providers:  Organization         Address  Phone   Notes  [facility] [address], [place] [phone] Also accepts self-pay patients.  [facility] [address] Laurell Josephs [place], [place]  [phone]   [facility] [address], [place] [phone]   [facility] [address], [place] [phone]   Renaye Rakers [address], [place]   [phone] Only accepts [place] Access [place] patients after they have their name applied to their card.   Self-Pay (no insurance) in [facility]:  Organization         Address  Phone   Notes  Sickle Cell Patients, [facility] [address] [place], [place] [phone]  [facility] [address], [place] [phone]) [phone]   [facility] [place]  [address], [place] [phone]) [phone]   [facility]/Dr. Osei-Bonsu  [address] [place] [address], [place] or [phone], [place] [phone] Phone number for both [place] and [place] locations is the same.  [facility] [address], [place] [phone]) [phone]   [facility] [place] [address] [place] [address], [place] or [address] [phone]) [phone]   [facility] [address], [place] [phone](336) [phone], phone; [phone](336) [phone], fax Sees patients 1st and 3rd Saturday of every month.  Must not qualify for public or private insurance (i.e. Medicaid, Medicare, [place] Health Choice, Veterans' Benefits)  Household income should be no more than 200% of the poverty level The clinic cannot treat you if you are  pregnant or think you are pregnant  Sexually transmitted diseases are not treated at the clinic.    Dental Care: Organization         Address  Phone  Notes  [facility] [address], [place] [phone] Accepts children up to age 40 who are enrolled in [place] or [place] Health Choice; pregnant women with a Medicaid card; and children who have applied for Medicaid or [place] Health Choice, but were declined, whose parents can pay a reduced fee at time of service.  [facility] [place]  [address], [place] [phone]) [phone] Accepts children up to age 921 who are enrolled in [place] or [place] Health Choice; pregnant women with a Medicaid card; and children who have applied for Medicaid or [place] Health Choice, but were declined, whose parents can pay a reduced fee at time of service.  [facility]  [address], [place] [phone] Patients are seen by appointment only. Walk-ins are not accepted. [facility] will see patients 40 years of age and older. Monday - Tuesday (8am-5pm) Most Wednesdays (8:30-5pm) $30 per visit, cash only  [facility]  [address], [facility] [phone] Patients are seen by appointment only. Walk-ins are not accepted. [facility] will see patients 40 years of age and older. One Wednesday Evening (Monthly: Volunteer Based).  $30 per visit, cash only  [employer] [facility]  [phone]) [phone] for adults; Children under age 814, call Graduate Pediatric Dentistry at [phone]. Children aged 524-14, please call [phone] to request a pediatric application.  Dental services are provided in all areas of dental care including fillings, crowns and bridges, complete and partial dentures, implants, gum treatment, root canals, and extractions. Preventive care is also provided. Treatment is provided to  both adults and children. Patients are selected via a lottery and there is often a waiting list.   [facility] [address], [place]  [phone]) [phone] [email or web address]   [facility] [address], [place], [place] [phone], Ext. [phone] Second and Fourth Thursday of each month, opens at 6:30 AM; Clinic ends at 9 AM.  Patients are seen on a first-come first-served basis, and a limited number are seen during each clinic.   [facility]  [address], [place], [place] [phone]) [phone]   Eligibility Requirements You must have lived in [place], [place], or [place] for at least the  last three months.   You cannot be eligible for state or federal sponsored [place], including [employer], [place], or [employer].   You generally cannot be eligible for healthcare insurance through your employer.    How to apply: Eligibility screenings are held every Tuesday and Wednesday afternoon from 1:00 pm until 4:00 pm. You do not need an appointment for the interview!  [facility] [address], [place], [place] [phone]   [facility] Health Department  [phone]   [facility] Health Department  [phone]   [facility]  [phone]    Behavioral Health Resources in the Community: Intensive Outpatient Programs Organization         Address  Phone  Notes  [facility] [address], [place], [place] [phone]   [facility]patient [address] [place], [place] [phone]   [facility] [address], [place], [place]  [phone]   [facility] Health [address]  [place], [place] [phone] or [phone]   Substance Abuse Resources Organization         Address  Phone  Notes  [facility]  [phone]   [facility]  [phone]   [facility] [place]   [phone]   Floydene Flock  [phone]   Residential & Outpatient Substance Abuse Program  [phone]   Psychological Services Organization         Address  Phone  Notes  [facility]  [phone]   [facility]  [phone]   [facility] [address] [place] [phone] or [phone]    Mobile Crisis Teams Organization         Address  Phone  Notes  [facility], Mobile Crisis Care Unit  [phone]   [facility]  [address] [place], [place] [phone]   Doristine Locks [address] [place] [phone]    Self-Help/Support Groups Organization         Address  Phone             Notes  [facility] [place] - variety of support groups  [phone]- I7437963 Call for more information  Narcotics Anonymous (NA), [facility] [address], [employer] [place]  2 meetings at this location   Statistician         Address  Phone  Notes  [facility] [address],    [place]  [phone]   [facility]  [address] [place] [address], [place], [place] [phone]   [facility] [address] [place], [place] [phone] Admissions: 8am-3pm M-F  [facility] [address]    [place], [place] [phone]   [facility] [address] [place], [place], [place] [phone]   [facility] [address]  [place], [place] [phone]   [facility] - Intensive Outpatient [address]ure[address], [place], [place] [phone]   [facility] ([facility] [address] [place].,  [place], [place] [phone] or [phone]   [facility] ([facility]) [address], [place], [place] [phone] Accepts Medicaid  [facility] [address],  [place] [phone] Substance Abuse/Addiction Treatment   [facility] Organization         Address  Phone  Notes  [facility]  [phone]  Angie FavaJulie Brannon, PhD [address], [address] [place], [place]   [phone]) [phone] or [phone]) [phone]   [facility]   [address] [place], [place] [phone]) [phone]   [facility] [address], [place], [place] [phone]) [phone] Insurance/Medicaid/sponsorship through [facility]  [facility] [address]                                    [place], [place] [phone]) [phone] Therapy/tele-psych/case  [facility] [address]   [place], [place] [phone]) [phone]    Dr. Lolly MustacheArfeen  [phone](336) [phone]   [facility] [place]  [employer] [facility] 1) [address], [place] 2) [address], [place] 3)  [address], [place] [phone]) [phone]) [phone]  [phone]) [phone]   [facility] Child Abuse Hotline [phone]) [phone] or [phone](336) [phone] (After Hours)

## 2013-07-23 NOTE — ED Provider Notes (Signed)
CSN: [id number]     Arrival date & time 07/23/13  2017 History   First MD Initiated Contact with Patient 07/23/13 2303     Chief Complaint  Patient presents with  . Dizziness     (Consider location/radiation/quality/duration/timing/severity/associated sxs/prior Treatment) Patient is a 38 y.o. female presenting with dizziness. The history is provided by the patient.  Dizziness Quality:  Head spinning Severity:  Moderate Onset quality:  Gradual Duration:  4 months Timing:  Intermittent Progression:  Unchanged Chronicity:  New Context: head movement   Relieved by:  Nothing Worsened by:  Nothing tried Ineffective treatments:  None tried Associated symptoms: no blood in stool, no tinnitus, no vomiting and no weakness   Risk factors: no anemia and no Meniere's disease     Past Medical History  Diagnosis Date  . Ectopic pregnancy    Past Surgical History  Procedure Laterality Date  . Tubal ligation    . Cholecystectomy     No family history on file. History  Substance Use Topics  . Smoking status: Current Every Day Smoker -- 0.50 packs/day  . Smokeless tobacco: Not on file  . Alcohol Use: Yes     Comment: social   OB History   Grav Para Term Preterm Abortions TAB SAB Ect Mult Living                 Review of Systems  HENT: Negative for tinnitus.   Gastrointestinal: Negative for vomiting and blood in stool.  Neurological: Positive for dizziness. Negative for seizures, facial asymmetry, speech difficulty, weakness and numbness.  All other systems reviewed and are negative.     Allergies  Review of patient's allergies indicates no known allergies.  Home Medications   Prior to Admission medications   Medication Sig Start Date End Date Taking? Authorizing Provider  diphenhydrAMINE (BENADRYL) 25 mg capsule Take 25 mg by mouth daily as needed for allergies.   Yes Historical Provider, MD  naproxen sodium (ANAPROX) 220 MG tablet Take 440 mg by mouth 3 (three) times  daily as needed (headache).   Yes Historical Provider, MD   BP 93/62  Pulse 54  Temp(Src) 98.6 F (37 C) (Oral)  Resp 18  Ht 5\' 11"  (1.803 m)  Wt 263 lb (119.296 kg)  BMI 36.70 kg/m2  SpO2 99%  LMP 07/12/2013 Physical Exam  Constitutional: She is oriented to person, place, and time. She appears well-developed and well-nourished. No distress.  HENT:  Head: Normocephalic and atraumatic.  Right Ear: External ear normal.  Left Ear: External ear normal.  Mouth/Throat: Oropharynx is clear and moist.  Eyes: Conjunctivae and EOM are normal.  Neck: Normal range of motion. Neck supple.  Cardiovascular: Normal rate, regular rhythm and intact distal pulses.   Pulmonary/Chest: Effort normal and breath sounds normal. She has no wheezes. She has no rales.  Abdominal: Soft. Bowel sounds are normal. There is no tenderness. There is no rebound and no guarding.  Musculoskeletal: Normal range of motion.  Lymphadenopathy:    She has no cervical adenopathy.  Neurological: She is alert and oriented to person, place, and time. She has normal reflexes. She displays normal reflexes. No cranial nerve deficit. She exhibits normal muscle tone. Coordination normal.  Skin: Skin is warm and dry.  Psychiatric: She has a normal mood and affect.    ED Course  Procedures (including critical care time) Labs Review Labs Reviewed  CBC WITH DIFFERENTIAL - Abnormal; Notable for the following:    RBC 5.23 (*)  All other components within normal limits  COMPREHENSIVE METABOLIC PANEL - Abnormal; Notable for the following:    Total Bilirubin 0.2 (*)    GFR calc non Af Amer 73 (*)    GFR calc Af Amer 84 (*)    All other components within normal limits    Imaging Review No results found.   EKG Interpretation None      MDM   Final diagnoses:  None    No ataxia, patient declines CT scan at this time.  Symptoms are with movement.      Jasmine AweApril K Shevawn Langenberg-Rasch, MD 07/23/13 2330

## 2013-07-23 NOTE — ED Notes (Signed)
Discharge instructions reviewed with pt. Pt verbalized understanding.
